# Patient Record
Sex: Female | Born: 1980 | Hispanic: Yes | Marital: Married | Smoking: Never smoker
Health system: Southern US, Community
[De-identification: ages and names within clinical notes are randomized; demographics above are authoritative.]

## PROBLEM LIST (undated history)

## (undated) DIAGNOSIS — N83209 Unspecified ovarian cyst, unspecified side: Secondary | ICD-10-CM

## (undated) HISTORY — PX: TUBAL LIGATION: SHX77

---

## 2004-07-04 ENCOUNTER — Emergency Department: Payer: Self-pay | Admitting: Emergency Medicine

## 2007-03-16 ENCOUNTER — Ambulatory Visit: Payer: Self-pay | Admitting: Family Medicine

## 2013-02-02 ENCOUNTER — Emergency Department: Payer: Self-pay | Admitting: Emergency Medicine

## 2013-02-02 LAB — URINALYSIS, COMPLETE
Bilirubin,UR: NEGATIVE
Glucose,UR: NEGATIVE mg/dL (ref 0–75)
Ketone: NEGATIVE
Leukocyte Esterase: NEGATIVE
Nitrite: NEGATIVE
Ph: 7 (ref 4.5–8.0)
Protein: NEGATIVE
RBC,UR: 1 /HPF (ref 0–5)
Specific Gravity: 1.008 (ref 1.003–1.030)
Squamous Epithelial: <1
WBC UR: NONE SEEN /HPF (ref 0–5)

## 2013-02-02 LAB — COMPREHENSIVE METABOLIC PANEL
Albumin: 3.9 g/dL (ref 3.4–5.0)
Alkaline Phosphatase: 96 U/L (ref 50–136)
Anion Gap: 3 — ABNORMAL LOW (ref 7–16)
BUN: 12 mg/dL (ref 7–18)
Bilirubin,Total: 0.4 mg/dL (ref 0.2–1.0)
Calcium, Total: 8.9 mg/dL (ref 8.5–10.1)
Chloride: 107 mmol/L (ref 98–107)
Co2: 28 mmol/L (ref 21–32)
Creatinine: 0.67 mg/dL (ref 0.60–1.30)
EGFR (African American): >60
EGFR (Non-African Amer.): >60
Glucose: 87 mg/dL (ref 65–99)
Osmolality: 275 (ref 275–301)
Potassium: 3.9 mmol/L (ref 3.5–5.1)
SGOT(AST): 35 U/L (ref 15–37)
SGPT (ALT): 49 U/L (ref 12–78)
Sodium: 138 mmol/L (ref 136–145)
Total Protein: 7.6 g/dL (ref 6.4–8.2)

## 2013-02-02 LAB — CBC
HCT: 41.2 % (ref 35.0–47.0)
HGB: 14.1 g/dL (ref 12.0–16.0)
MCH: 31.3 pg (ref 26.0–34.0)
MCHC: 34.3 g/dL (ref 32.0–36.0)
MCV: 91 fL (ref 80–100)
Platelet: 241 10*3/uL (ref 150–440)
RBC: 4.51 10*6/uL (ref 3.80–5.20)
RDW: 13.2 % (ref 11.5–14.5)
WBC: 9.2 10*3/uL (ref 3.6–11.0)

## 2013-02-02 LAB — LIPASE, BLOOD: Lipase: 136 U/L (ref 73–393)

## 2013-02-02 LAB — GC/CHLAMYDIA PROBE AMP

## 2013-02-02 LAB — PREGNANCY, URINE: Pregnancy Test, Urine: NEGATIVE m[IU]/mL

## 2013-02-02 LAB — WET PREP, GENITAL

## 2013-05-02 ENCOUNTER — Emergency Department: Payer: Self-pay | Admitting: Emergency Medicine

## 2013-06-13 ENCOUNTER — Emergency Department: Payer: Self-pay | Admitting: Emergency Medicine
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
## 2014-06-23 ENCOUNTER — Ambulatory Visit: Payer: Self-pay

## 2014-07-08 ENCOUNTER — Ambulatory Visit: Payer: Self-pay | Admitting: General Surgery

## 2014-07-08 ENCOUNTER — Encounter: Payer: Self-pay | Admitting: *Deleted

## 2015-10-20 ENCOUNTER — Emergency Department: Payer: Self-pay

## 2015-10-20 ENCOUNTER — Encounter: Payer: Self-pay | Admitting: Emergency Medicine

## 2015-10-20 ENCOUNTER — Emergency Department
Admission: EM | Admit: 2015-10-20 | Discharge: 2015-10-20 | Disposition: A | Discharge status: Home / Self Care | Payer: Self-pay | Attending: Emergency Medicine | Admitting: Emergency Medicine

## 2015-10-20 DIAGNOSIS — Z3202 Encounter for pregnancy test, result negative: Secondary | ICD-10-CM | POA: Insufficient documentation

## 2015-10-20 DIAGNOSIS — R102 Pelvic and perineal pain unspecified side: Secondary | ICD-10-CM

## 2015-10-20 DIAGNOSIS — R109 Unspecified abdominal pain: Secondary | ICD-10-CM

## 2015-10-20 DIAGNOSIS — R10A0 Flank pain, unspecified side: Secondary | ICD-10-CM

## 2015-10-20 DIAGNOSIS — N83202 Unspecified ovarian cyst, left side: Secondary | ICD-10-CM

## 2015-10-20 LAB — COMPREHENSIVE METABOLIC PANEL
ALT: 24 U/L (ref 14–54)
AST: 21 U/L (ref 15–41)
Albumin: 4.1 g/dL (ref 3.5–5.0)
Alkaline Phosphatase: 71 U/L (ref 38–126)
Anion gap: 8 (ref 5–15)
BUN: 8 mg/dL (ref 6–20)
CO2: 26 mmol/L (ref 22–32)
Calcium: 9.1 mg/dL (ref 8.9–10.3)
Chloride: 105 mmol/L (ref 101–111)
Creatinine, Ser: 0.59 mg/dL (ref 0.44–1.00)
GFR calc Af Amer: >60 mL/min (ref >60)
GFR calc non Af Amer: >60 mL/min (ref >60)
Glucose, Bld: 101 mg/dL — ABNORMAL HIGH (ref 65–99)
Potassium: 3.5 mmol/L (ref 3.5–5.1)
Sodium: 139 mmol/L (ref 135–145)
Total Bilirubin: 0.7 mg/dL (ref 0.3–1.2)
Total Protein: 7.5 g/dL (ref 6.5–8.1)

## 2015-10-20 LAB — CBC
HCT: 41.5 % (ref 35.0–47.0)
Hemoglobin: 14.3 g/dL (ref 12.0–16.0)
MCH: 32.1 pg (ref 26.0–34.0)
MCHC: 34.4 g/dL (ref 32.0–36.0)
MCV: 93.2 fL (ref 80.0–100.0)
Platelets: 219 10*3/uL (ref 150–440)
RBC: 4.45 MIL/uL (ref 3.80–5.20)
RDW: 13.2 % (ref 11.5–14.5)
WBC: 8.7 10*3/uL (ref 3.6–11.0)

## 2015-10-20 LAB — URINALYSIS COMPLETE WITH MICROSCOPIC (ARMC ONLY)
Bacteria, UA: NONE SEEN
Bilirubin Urine: NEGATIVE
Glucose, UA: NEGATIVE mg/dL
Ketones, ur: NEGATIVE mg/dL
Leukocytes, UA: NEGATIVE
Nitrite: NEGATIVE
Protein, ur: NEGATIVE mg/dL
Specific Gravity, Urine: 1.004 — ABNORMAL LOW (ref 1.005–1.030)
pH: 6 (ref 5.0–8.0)

## 2015-10-20 LAB — LIPASE, BLOOD: Lipase: 18 U/L (ref 11–51)

## 2015-10-20 LAB — CHLAMYDIA/NGC RT PCR (ARMC ONLY)
Chlamydia Tr: NOT DETECTED
N gonorrhoeae: NOT DETECTED

## 2015-10-20 LAB — WET PREP, GENITAL
Clue Cells Wet Prep HPF POC: NONE SEEN
Sperm: NONE SEEN
Trich, Wet Prep: NONE SEEN
Yeast Wet Prep HPF POC: NONE SEEN

## 2015-10-20 LAB — POCT PREGNANCY, URINE: Preg Test, Ur: NEGATIVE

## 2015-10-20 MED ORDER — ACETAMINOPHEN 325 MG PO TABS
650.0000 mg | ORAL_TABLET | Freq: Once | ORAL | Status: AC
  Administered 2015-10-20: 650 mg via ORAL
  Filled 2015-10-20: qty 2

## 2015-10-20 MED ORDER — OXYCODONE-ACETAMINOPHEN 5-325 MG PO TABS
1.0000 | ORAL_TABLET | Freq: Once | ORAL | Status: AC
  Administered 2015-10-20: 1 via ORAL

## 2015-10-20 MED ORDER — FENTANYL CITRATE (PF) 100 MCG/2ML IJ SOLN
50.0000 ug | Freq: Once | INTRAMUSCULAR | Status: AC
  Administered 2015-10-20: 50 ug via INTRAVENOUS

## 2015-10-20 MED ORDER — OXYCODONE-ACETAMINOPHEN 5-325 MG PO TABS: 1.0000 | ORAL_TABLET | Freq: Four times a day (QID) | ORAL | Status: DC | PRN

## 2015-10-20 MED ORDER — FENTANYL CITRATE (PF) 100 MCG/2ML IJ SOLN
INTRAMUSCULAR | Status: AC
  Administered 2015-10-20: 50 ug via INTRAVENOUS
  Filled 2015-10-20: qty 2

## 2015-10-20 MED ORDER — OXYCODONE-ACETAMINOPHEN 5-325 MG PO TABS
ORAL_TABLET | ORAL | Status: AC
  Filled 2015-10-20: qty 1

## 2015-10-20 NOTE — Discharge Instructions (Signed)
Dolor abdominal en adultos °(Abdominal Pain, Adult) °El dolor puede tener muchas causas. Normalmente la causa del dolor abdominal no es una enfermedad y mejorará sin tratamiento. Frecuentemente puede controlarse y tratarse en casa. Su médico le realizará un examen físico y posiblemente solicite análisis de sangre y radiografías para ayudar a determinar la gravedad de su dolor. Sin embargo, en muchos casos, debe transcurrir más tiempo antes de que se pueda encontrar una causa evidente del dolor. Antes de llegar a ese punto, es posible que su médico no sepa si necesita más pruebas o un tratamiento más profundo. °INSTRUCCIONES PARA EL CUIDADO EN EL HOGAR  °Esté atento al dolor para ver si hay cambios. Las siguientes indicaciones ayudarán a aliviar cualquier molestia que pueda sentir: °· Tome solo medicamentos de venta libre o recetados, según las indicaciones del médico. °· No tome laxantes a menos que se lo haya indicado su médico. °· Pruebe con una dieta líquida absoluta (caldo, té o agua) según se lo indique su médico. Introduzca gradualmente una dieta normal, según su tolerancia. °SOLICITE ATENCIÓN MÉDICA SI: °· Tiene dolor abdominal sin explicación. °· Tiene dolor abdominal relacionado con náuseas o diarrea. °· Tiene dolor cuando orina o defeca. °· Experimenta dolor abdominal que lo despierta de noche. °· Tiene dolor abdominal que empeora o mejora cuando come alimentos. °· Tiene dolor abdominal que empeora cuando come alimentos grasosos. °· Tiene fiebre. °SOLICITE ATENCIÓN MÉDICA DE INMEDIATO SI:  °· El dolor no desaparece en un plazo máximo de 2 horas. °· No deja de (vomitar). °· El dolor se siente solo en partes del abdomen, como el lado derecho o la parte inferior izquierda del abdomen. °· Evacúa materia fecal sanguinolenta o negra, de aspecto alquitranado. °ASEGÚRESE DE QUE: °· Comprende estas instrucciones. °· Controlará su afección. °· Recibirá ayuda de inmediato si no mejora o si empeora. °  °Esta  información no tiene como fin reemplazar el consejo del médico. Asegúrese de hacerle al médico cualquier pregunta que tenga. °  °Document Released: 08/01/2005 Document Revised: 08/22/2014 °Elsevier Interactive Patient Education ©2016 [employer] ° °

## 2015-10-20 NOTE — ED Notes (Signed)
Pt has called for a ride so she is waiting for now in the room.

## 2015-10-20 NOTE — ED Notes (Signed)
Has had left side flank pain .  Vomited x 2 today.  Saw pcp 2 week ago for ?uti and was given med.  Now pain is also in left lower quad.

## 2015-10-20 NOTE — ED Provider Notes (Addendum)
[facility] Emergency Department Provider Note  ____________________________________________   I have reviewed the triage vital signs and the nursing notes.   HISTORY  Chief Complaint Abdominal Pain; Flank Pain; and Nausea    HPI Sonia Nelson is a 35 y.o. female who is largely healthy. She states that 3 weeks ago she had some flank pain and though she does not recall dysuria,she was given antibiotics for possible infection.  After 6 days the abx ran out and the pain went away but then it came back. It was sudden onset again a few days ago. It actually started with her menstrual period which ended 6 days ago. It is in the L  flank towards the Llq. She did vomit x 2 nbnb when it got bad this am.  Nothing makes it better nothing makes it worse. Comes and goes. No fever.  No dysuria.  Has no recollection of prior sx like this. No personal or family hx of stones.  She has no cp no sob.  No diarrhea.   History reviewed. No pertinent past medical history.  There are no active problems to display for this patient.   No past surgical history on file.  No current outpatient prescriptions on file.  Allergies Shellfish allergy  No family history on file.  Social History Social History  Substance Use Topics  . Smoking status: Never Smoker   . Smokeless tobacco: None  . Alcohol Use: Yes    Review of Systems Constitutional: No fever/chills Eyes: No visual changes. ENT: No sore throat. No stiff neck no neck pain Cardiovascular: Denies chest pain. Respiratory: Denies shortness of breath. Gastrointestinal:   +  vomiting.  No diarrhea.  No constipation. Genitourinary: Negative for dysuria. Musculoskeletal: Negative lower extremity swelling Skin: Negative for rash. Neurological: Negative for headaches, focal weakness or numbness. 10-point ROS otherwise negative.  ____________________________________________   PHYSICAL EXAM:  VITAL SIGNS: ED Triage  Vitals  Enc Vitals Group     BP 10/20/15 1053 125/62 mmHg     Pulse Rate 10/20/15 1053 82     Resp 10/20/15 1053 16     Temp 10/20/15 1053 98 F (36.7 C)     Temp Source 10/20/15 1053 Oral     SpO2 10/20/15 1053 98 %     Weight 10/20/15 1053 180 lb (81.647 kg)     Height 10/20/15 1053 5\' 6"  (1.676 m)     Head Cir --      Peak Flow --      Pain Score 10/20/15 1054 8     Pain Loc --      Pain Edu? --      Excl. in GC? --     Constitutional: Alert and oriented. Well appearing and in no acute distress. Eyes: Conjunctivae are normal. PERRL. EOMI. Head: Atraumatic. Nose: No congestion/rhinnorhea. Mouth/Throat: Mucous membranes are moist.  Oropharynx non-erythematous. Neck: No stridor.   Nontender with no meningismus Cardiovascular: Normal rate, regular rhythm. Grossly normal heart sounds.  Good peripheral circulation. Respiratory: Normal respiratory effort.  No retractions. Lungs CTAB. Abdominal: Soft and minimally tender in the llq, which could be c/w referred flank pain  No distention. No guarding no rebound Back:  There is no focal tenderness or step off there is no midline tenderness there are no lesions noted. there is mild L CVA tenderness Musculoskeletal: No lower extremity tenderness. No joint effusions, no DVT signs strong distal pulses no edema Neurologic:  Normal speech and language. No gross focal  neurologic deficits are appreciated.  Skin:  Skin is warm, dry and intact. No rash noted. Psychiatric: Mood and affect are normal. Speech and behavior are normal.  ____________________________________________   LABS (all labs ordered are listed, but only abnormal results are displayed)  Labs Reviewed  COMPREHENSIVE METABOLIC PANEL - Abnormal; Notable for the following:    Glucose, Bld 101 (*)    All other components within normal limits  URINALYSIS COMPLETEWITH MICROSCOPIC ([facility] ONLY) - Abnormal; Notable for the following:    Color, Urine STRAW (*)    APPearance CLEAR (*)     Specific Gravity, Urine 1.004 (*)    Hgb urine dipstick 2+ (*)    Squamous Epithelial / LPF 0-5 (*)    All other components within normal limits  LIPASE, BLOOD  CBC  POC URINE PREG, ED  POCT PREGNANCY, URINE   ____________________________________________  EKG  I personally interpreted any EKGs ordered by me or triage  ____________________________________________  RADIOLOGY  I reviewed any imaging ordered by me or triage that were performed during my shift and, if possible, patient and/or family made aware of any abnormal findings. ____________________________________________   PROCEDURES  Procedure(s) performed: None  Critical Care performed: None  ____________________________________________   INITIAL IMPRESSION / ASSESSMENT AND PLAN / ED COURSE  Pertinent labs & imaging results that were available during my care of the patient were reviewed by me and considered in my medical decision making (see chart for details).  Patient with hematuria microscopic, and on and off flank pain on the left the last several weeks. Denies dysuria. Does have microscopic hematuria.  ----------------------------------------- 6:06 PM on 10/20/2015 -----------------------------------------  Patient very careful. I did advise her she must drive home after her pain medication made given her here. Patient now recalls that she does have history of ovarian cysts, and ultrasound does show an ovarian cyst. No evidence of significant hemorrhage, she is feeling much better. Extensive return precautions for lightheadedness or increased pain given. Does not have hemoperitoneum at this time. We did perform a pelvic exam on her,  Pelvic exam: Female nurse chaperone present, no external lesions noted, physiologic vaginal discharge noted with no purulent discharge, no cervical motion tenderness, no adnexal mass, positive for left adnexal tenderness there is no significant uterine tenderness or mass. No  vaginal bleeding____________________________________________   FINAL CLINICAL IMPRESSION(S) / ED DIAGNOSES  Final diagnoses:  Flank pain      This chart was dictated using voice recognition software.  Despite best efforts to proofread,  errors can occur which can change meaning.     Jeanmarie Plant, MD 10/20/15 1444  Jeanmarie Plant, MD 10/20/15 [phone]

## 2015-10-23 LAB — URINE CULTURE

## 2016-12-02 ENCOUNTER — Encounter: Payer: Self-pay | Admitting: Emergency Medicine

## 2016-12-02 ENCOUNTER — Emergency Department
Admission: EM | Admit: 2016-12-02 | Discharge: 2016-12-02 | Disposition: A | Discharge status: Home / Self Care | Payer: Self-pay | Attending: Emergency Medicine | Admitting: Emergency Medicine

## 2016-12-02 DIAGNOSIS — X58XXXA Exposure to other specified factors, initial encounter: Secondary | ICD-10-CM | POA: Insufficient documentation

## 2016-12-02 DIAGNOSIS — Z79899 Other long term (current) drug therapy: Secondary | ICD-10-CM | POA: Insufficient documentation

## 2016-12-02 DIAGNOSIS — Y939 Activity, unspecified: Secondary | ICD-10-CM | POA: Insufficient documentation

## 2016-12-02 DIAGNOSIS — Y999 Unspecified external cause status: Secondary | ICD-10-CM | POA: Insufficient documentation

## 2016-12-02 DIAGNOSIS — N760 Acute vaginitis: Secondary | ICD-10-CM | POA: Insufficient documentation

## 2016-12-02 DIAGNOSIS — T192XXA Foreign body in vulva and vagina, initial encounter: Secondary | ICD-10-CM | POA: Insufficient documentation

## 2016-12-02 DIAGNOSIS — B9689 Other specified bacterial agents as the cause of diseases classified elsewhere: Secondary | ICD-10-CM

## 2016-12-02 DIAGNOSIS — Y929 Unspecified place or not applicable: Secondary | ICD-10-CM | POA: Insufficient documentation

## 2016-12-02 HISTORY — DX: Unspecified ovarian cyst, unspecified side: N83.209

## 2016-12-02 LAB — CBC
HCT: 40.5 % (ref 35.0–47.0)
Hemoglobin: 13.7 g/dL (ref 12.0–16.0)
MCH: 31.9 pg (ref 26.0–34.0)
MCHC: 33.7 g/dL (ref 32.0–36.0)
MCV: 94.5 fL (ref 80.0–100.0)
Platelets: 286 10*3/uL (ref 150–440)
RBC: 4.29 MIL/uL (ref 3.80–5.20)
RDW: 12.8 % (ref 11.5–14.5)
WBC: 8.4 10*3/uL (ref 3.6–11.0)

## 2016-12-02 LAB — COMPREHENSIVE METABOLIC PANEL
ALT: 34 U/L (ref 14–54)
AST: 33 U/L (ref 15–41)
Albumin: 4.3 g/dL (ref 3.5–5.0)
Alkaline Phosphatase: 68 U/L (ref 38–126)
Anion gap: 6 (ref 5–15)
BUN: 8 mg/dL (ref 6–20)
CO2: 25 mmol/L (ref 22–32)
Calcium: 9.4 mg/dL (ref 8.9–10.3)
Chloride: 106 mmol/L (ref 101–111)
Creatinine, Ser: 0.64 mg/dL (ref 0.44–1.00)
GFR calc Af Amer: >60 mL/min (ref >60)
GFR calc non Af Amer: >60 mL/min (ref >60)
Glucose, Bld: 91 mg/dL (ref 65–99)
Potassium: 3.6 mmol/L (ref 3.5–5.1)
Sodium: 137 mmol/L (ref 135–145)
Total Bilirubin: 0.7 mg/dL (ref 0.3–1.2)
Total Protein: 7.7 g/dL (ref 6.5–8.1)

## 2016-12-02 LAB — URINALYSIS, COMPLETE (UACMP) WITH MICROSCOPIC
Bilirubin Urine: NEGATIVE
Glucose, UA: NEGATIVE mg/dL
Ketones, ur: NEGATIVE mg/dL
Leukocytes, UA: NEGATIVE
Nitrite: NEGATIVE
Protein, ur: NEGATIVE mg/dL
Specific Gravity, Urine: 1.003 — ABNORMAL LOW (ref 1.005–1.030)
pH: 7 (ref 5.0–8.0)

## 2016-12-02 LAB — LIPASE, BLOOD: Lipase: 28 U/L (ref 11–51)

## 2016-12-02 LAB — CHLAMYDIA/NGC RT PCR (ARMC ONLY)
Chlamydia Tr: NOT DETECTED
N gonorrhoeae: NOT DETECTED

## 2016-12-02 LAB — WET PREP, GENITAL
Sperm: NONE SEEN
Trich, Wet Prep: NONE SEEN
Yeast Wet Prep HPF POC: NONE SEEN

## 2016-12-02 LAB — POCT PREGNANCY, URINE: Preg Test, Ur: NEGATIVE

## 2016-12-02 MED ORDER — IBUPROFEN 600 MG PO TABS
ORAL_TABLET | ORAL | Status: AC
  Administered 2016-12-02: 600 mg via ORAL
  Filled 2016-12-02: qty 1

## 2016-12-02 MED ORDER — METRONIDAZOLE 500 MG PO TABS: 500.0000 mg | ORAL_TABLET | Freq: Two times a day (BID) | ORAL | 0 refills | Status: AC

## 2016-12-02 MED ORDER — HYDROCODONE-ACETAMINOPHEN 5-325 MG PO TABS
1.0000 | ORAL_TABLET | Freq: Once | ORAL | Status: AC
  Administered 2016-12-02: 1 via ORAL
  Filled 2016-12-02: qty 1

## 2016-12-02 MED ORDER — IBUPROFEN 400 MG PO TABS
600.0000 mg | ORAL_TABLET | Freq: Once | ORAL | Status: AC
  Administered 2016-12-02: 600 mg via ORAL

## 2016-12-02 MED ORDER — CEFTRIAXONE SODIUM 250 MG IJ SOLR
250.0000 mg | Freq: Once | INTRAMUSCULAR | Status: AC
  Administered 2016-12-02: 250 mg via INTRAMUSCULAR
  Filled 2016-12-02: qty 250

## 2016-12-02 MED ORDER — AZITHROMYCIN 250 MG PO TABS
1000.0000 mg | ORAL_TABLET | Freq: Once | ORAL | Status: AC
  Administered 2016-12-02: 1000 mg via ORAL
  Filled 2016-12-02: qty 4

## 2016-12-02 MED ORDER — LIDOCAINE HCL (PF) 1 % IJ SOLN
INTRAMUSCULAR | Status: AC
  Administered 2016-12-02: 5 mL
  Filled 2016-12-02: qty 5

## 2016-12-02 MED ORDER — CEPHALEXIN 500 MG PO CAPS: 500.0000 mg | ORAL_CAPSULE | Freq: Four times a day (QID) | ORAL | 0 refills | Status: AC

## 2016-12-02 NOTE — ED Provider Notes (Signed)
[facility] Emergency Department Provider Note  ____________________________________________   First MD Initiated Contact with Patient 12/02/16 1748     (approximate)  I have reviewed the triage vital signs and the nursing notes.   HISTORY  Chief Complaint Flank Pain; Abdominal Pain; and Vaginal Discharge    HPI Sonia Nelson is a 36 y.o. female who comes to the emergency Department with roughly 2 weeks of foul-smelling vaginal discharge. She reports thin foul-smelling fluid draining from her vagina. Her last period was 3 weeks ago. She last had sex about 2 weeks ago. She does report a temperature of 101 about a week ago but that is resolved. She recently returned from [place] trip to [place]She does report some dysuria but no frequency or hesitancy. She denies back pain.   Past Medical History:  Diagnosis Date  . Ovarian cyst     There are no active problems to display for this patient.   Past Surgical History:  Procedure Laterality Date  . TUBAL LIGATION      Prior to Admission medications   Medication Sig Start Date End Date Taking? Authorizing Provider  oxyCODONE-acetaminophen (ROXICET) 5-325 MG tablet Take 1 tablet by mouth every 6 (six) hours as needed. 10/20/15   Jeanmarie Plant, MD    Allergies Shellfish allergy  No family history on file.  Social History Social History  Substance Use Topics  . Smoking status: Never Smoker  . Smokeless tobacco: Never Used  . Alcohol use Yes     Comment: occasionally    Review of Systems Constitutional: Positive fever Eyes: No visual changes. ENT: No sore throat. Cardiovascular: Denies chest pain. Respiratory: Denies shortness of breath. Gastrointestinal: Positive abdominal pain.  No nausea, no vomiting.  No diarrhea.  No constipation. Genitourinary: Positive for dysuria. Musculoskeletal: Negative for back pain. Neurological: Negative for headaches, focal weakness or  numbness.  10-point ROS otherwise negative.  ____________________________________________   PHYSICAL EXAM:  VITAL SIGNS: ED Triage Vitals  Enc Vitals Group     BP 12/02/16 1649 132/70     Pulse Rate 12/02/16 1649 86     Resp 12/02/16 1649 18     Temp 12/02/16 1649 98.5 F (36.9 C)     Temp Source 12/02/16 1649 Oral     SpO2 12/02/16 1649 97 %     Weight 12/02/16 1650 175 lb (79.4 kg)     Height 12/02/16 1650  (1.651 m)     Head Circumference --      Peak Flow --      Pain Score 12/02/16 1648 6     Pain Loc --      Pain Edu? --      Excl. in GC? --     Constitutional: Alert and oriented x 4 well appearing nontoxic no diaphoresis speaks in full, clear sentences Eyes: PERRL EOMI. Head: Atraumatic. Nose: No congestion/rhinnorhea. Mouth/Throat: No trismus Neck: No stridor.   Cardiovascular: Normal rate, regular rhythm. Grossly normal heart sounds.  Good peripheral circulation. Respiratory: Normal respiratory effort.  No retractions. Lungs CTAB and moving good air Gastrointestinal: Soft nondistended nontender no rebound no guarding no peritonitis no McBurney's tenderness negative Rovsing's no costovertebral tenderness negative Murphy's Genitourinary: Genital exam performed with female chaperone: Normal external exam large gray brown discolored foreign body visualized in the vault easily removed with ring forceps copious amount of thin gray foul-smelling discharge in the vault all sclerae no cervical motion tenderness no adnexal tenderness Musculoskeletal: No  lower extremity edema   Neurologic:  Normal speech and language. No gross focal neurologic deficits are appreciated. Skin:  Skin is warm, dry and intact. No rash noted. Psychiatric: Mood and affect are normal. Speech and behavior are normal.    ____________________________________________   LABS (all labs ordered are listed, but only abnormal results are displayed)  Labs Reviewed  URINALYSIS, COMPLETE (UACMP)  WITH MICROSCOPIC - Abnormal; Notable for the following:       Result Value   Color, Urine STRAW (*)    APPearance CLEAR (*)    Specific Gravity, Urine 1.003 (*)    Hgb urine dipstick SMALL (*)    Bacteria, UA RARE (*)    Squamous Epithelial / LPF 0-5 (*)    All other components within normal limits  WET PREP, GENITAL  CHLAMYDIA/NGC RT PCR ([facility] ONLY)  LIPASE, BLOOD  COMPREHENSIVE METABOLIC PANEL  CBC  POCT PREGNANCY, URINE  POC URINE PREG, ED     __________________________________________  EKG   ____________________________________________  RADIOLOGY   ____________________________________________   PROCEDURES  Procedure(s) performed: no  Procedures  Critical Care performed: no  ____________________________________________   INITIAL IMPRESSION / ASSESSMENT AND PLAN / ED COURSE  Pertinent labs & imaging results that were available during my care of the patient were reviewed by me and considered in my medical decision making (see chart for details).  Genital exam on this patient revealed a retained tampon. The patient feels significantly improved after removal. She does have a copious amount of thin gray discharge and she is positive for bacterial vaginosis. I swabbed for GC and CT and offered her expectant management waiting for the results versus treatment here today and she opted for treatment which I think is reasonable. I will also treat her with a short course of Keflex for prophylaxis against toxic shock syndrome. She is hemodynamically stable and has no evidence of this at this point. She is discharged home in improved condition.      ____________________________________________   FINAL CLINICAL IMPRESSION(S) / ED DIAGNOSES  Final diagnoses:  None      NEW MEDICATIONS STARTED DURING THIS VISIT:  New Prescriptions   No medications on file     Note:  This document was prepared using Dragon voice recognition software and may include  unintentional dictation errors.     Merrily Brittle, MD 12/03/16 1025

## 2016-12-02 NOTE — Discharge Instructions (Addendum)
Please take all of your antibiotics as prescribed. Return to the emergency department sooner for any new or worsening symptoms such as fevers, chills, worsening pain, or for any other concerns whatsoever.  It was a pleasure to take care of you today, and thank you for coming to our emergency department.  If you have any questions or concerns before leaving please ask the nurse to grab me and I'm more than happy to go through your aftercare instructions again.  If you were prescribed any opioid pain medication today such as Norco, Vicodin, Percocet, morphine, hydrocodone, or oxycodone please make sure you do not drive when you are taking this medication as it can alter your ability to drive safely.  If you have any concerns once you are home that you are not improving or are in fact getting worse before you can make it to your follow-up appointment, please do not hesitate to call 911 and come back for further evaluation.  Merrily Brittle MD  Results for orders placed or performed during the hospital encounter of 12/02/16  Wet prep, genital  Result Value Ref Range   Yeast Wet Prep HPF POC NONE SEEN NONE SEEN   Trich, Wet Prep NONE SEEN NONE SEEN   Clue Cells Wet Prep HPF POC PRESENT (A) NONE SEEN   WBC, Wet Prep HPF POC MODERATE (A) NONE SEEN   Sperm NONE SEEN   Lipase, blood  Result Value Ref Range   Lipase 28 11 - 51 U/L  Comprehensive metabolic panel  Result Value Ref Range   Sodium 137 135 - 145 mmol/L   Potassium 3.6 3.5 - 5.1 mmol/L   Chloride 106 101 - 111 mmol/L   CO2 25 22 - 32 mmol/L   Glucose, Bld 91 65 - 99 mg/dL   BUN 8 6 - 20 mg/dL   Creatinine, Ser 1.61 0.44 - 1.00 mg/dL   Calcium 9.4 8.9 - 09.6 mg/dL   Total Protein 7.7 6.5 - 8.1 g/dL   Albumin 4.3 3.5 - 5.0 g/dL   AST 33 15 - 41 U/L   ALT 34 14 - 54 U/L   Alkaline Phosphatase 68 38 - 126 U/L   Total Bilirubin 0.7 0.3 - 1.2 mg/dL   GFR calc non Af Amer >60 >60 mL/min   GFR calc Af Amer >60 >60 mL/min   Anion gap 6  5 - 15  CBC  Result Value Ref Range   WBC 8.4 3.6 - 11.0 K/uL   RBC 4.29 3.80 - 5.20 MIL/uL   Hemoglobin 13.7 12.0 - 16.0 g/dL   HCT 04.5 40.9 - 81.1 %   MCV 94.5 80.0 - 100.0 fL   MCH 31.9 26.0 - 34.0 pg   MCHC 33.7 32.0 - 36.0 g/dL   RDW 91.4 78.2 - 95.6 %   Platelets 286 150 - 440 K/uL  Urinalysis, Complete w Microscopic  Result Value Ref Range   Color, Urine STRAW (A) YELLOW   APPearance CLEAR (A) CLEAR   Specific Gravity, Urine 1.003 (L) 1.005 - 1.030   pH 7.0 5.0 - 8.0   Glucose, UA NEGATIVE NEGATIVE mg/dL   Hgb urine dipstick SMALL (A) NEGATIVE   Bilirubin Urine NEGATIVE NEGATIVE   Ketones, ur NEGATIVE NEGATIVE mg/dL   Protein, ur NEGATIVE NEGATIVE mg/dL   Nitrite NEGATIVE NEGATIVE   Leukocytes, UA NEGATIVE NEGATIVE   RBC / HPF 0-5 0 - 5 RBC/hpf   WBC, UA 0-5 0 - 5 WBC/hpf   Bacteria, UA RARE (A)  NONE SEEN   Squamous Epithelial / LPF 0-5 (A) NONE SEEN  Pregnancy, urine POC  Result Value Ref Range   Preg Test, Ur NEGATIVE NEGATIVE

## 2016-12-02 NOTE — ED Triage Notes (Signed)
Patient presents to the ED with abdominal pain, pelvic pain and flank pain x 2-3 days.  Patient reports she was in [place] last week and while she was there she had a fever, then several days ago patient is having yellow foul smelling vaginal discharge.  Patient denies any new sexual partners.

## 2018-08-23 ENCOUNTER — Emergency Department: Payer: Worker's Compensation

## 2018-08-23 ENCOUNTER — Encounter: Payer: Self-pay | Admitting: Emergency Medicine

## 2018-08-23 ENCOUNTER — Other Ambulatory Visit: Payer: Self-pay

## 2018-08-23 ENCOUNTER — Emergency Department
Admission: EM | Admit: 2018-08-23 | Discharge: 2018-08-23 | Disposition: A | Discharge status: Home / Self Care | Payer: Worker's Compensation | Attending: Emergency Medicine | Admitting: Emergency Medicine

## 2018-08-23 DIAGNOSIS — Y998 Other external cause status: Secondary | ICD-10-CM | POA: Diagnosis not present

## 2018-08-23 DIAGNOSIS — W19XXXA Unspecified fall, initial encounter: Secondary | ICD-10-CM

## 2018-08-23 DIAGNOSIS — Y9301 Activity, walking, marching and hiking: Secondary | ICD-10-CM | POA: Diagnosis not present

## 2018-08-23 DIAGNOSIS — S199XXA Unspecified injury of neck, initial encounter: Secondary | ICD-10-CM | POA: Diagnosis not present

## 2018-08-23 DIAGNOSIS — Y9289 Other specified places as the place of occurrence of the external cause: Secondary | ICD-10-CM | POA: Insufficient documentation

## 2018-08-23 DIAGNOSIS — S3992XA Unspecified injury of lower back, initial encounter: Secondary | ICD-10-CM | POA: Diagnosis not present

## 2018-08-23 DIAGNOSIS — W010XXA Fall on same level from slipping, tripping and stumbling without subsequent striking against object, initial encounter: Secondary | ICD-10-CM | POA: Diagnosis not present

## 2018-08-23 DIAGNOSIS — M62838 Other muscle spasm: Secondary | ICD-10-CM | POA: Insufficient documentation

## 2018-08-23 DIAGNOSIS — M545 Low back pain, unspecified: Secondary | ICD-10-CM

## 2018-08-23 DIAGNOSIS — S4992XA Unspecified injury of left shoulder and upper arm, initial encounter: Secondary | ICD-10-CM | POA: Diagnosis present

## 2018-08-23 MED ORDER — ACETAMINOPHEN 500 MG PO TABS
1000.0000 mg | ORAL_TABLET | Freq: Once | ORAL | Status: AC
  Administered 2018-08-23: 1000 mg via ORAL
  Filled 2018-08-23: qty 2

## 2018-08-23 MED ORDER — OXYCODONE-ACETAMINOPHEN 5-325 MG PO TABS: 1.0000 | ORAL_TABLET | ORAL | 0 refills | Status: AC | PRN

## 2018-08-23 MED ORDER — IBUPROFEN 800 MG PO TABS: 800.0000 mg | ORAL_TABLET | Freq: Three times a day (TID) | ORAL | 0 refills | Status: AC | PRN

## 2018-08-23 MED ORDER — OXYCODONE HCL 5 MG PO TABS
5.0000 mg | ORAL_TABLET | Freq: Once | ORAL | Status: AC
  Administered 2018-08-23: 5 mg via ORAL
  Filled 2018-08-23: qty 1

## 2018-08-23 MED ORDER — CYCLOBENZAPRINE HCL 10 MG PO TABS
5.0000 mg | ORAL_TABLET | Freq: Once | ORAL | Status: AC
  Administered 2018-08-23: 5 mg via ORAL
  Filled 2018-08-23: qty 1

## 2018-08-23 MED ORDER — CYCLOBENZAPRINE HCL 5 MG PO TABS: ORAL_TABLET | ORAL | 0 refills | Status: AC

## 2018-08-23 NOTE — ED Triage Notes (Signed)
Fell at work. Slipped on a ramp.

## 2018-08-23 NOTE — Discharge Instructions (Signed)
You were seen today after a fall.  Your x-rays do not show any broken bones but do show that you are having some muscle spasms in your neck.  Please begin Flexeril and ibuprofen for pain.  You may also take Percocet for extreme pain.  Follow-up with primary care on Monday for reevaluation.

## 2018-08-23 NOTE — ED Provider Notes (Signed)
[facility] Emergency Department Provider Note  ____________________________________________  Time seen: Approximately 12:43 PM  I have reviewed the triage vital signs and the nursing notes.   HISTORY  Chief Complaint Fall and Back Pain    HPI Sonia Nelson is a 38 y.o. female that presents emergency department for evaluation of whole body pain after falling on a slippery ramp at work today.  Patient states that she slipped and fell backwards landing on her back.  She needed her boss to help her get up.  She was given two "pills "of "probably ibuprofen."  She proceeded to drive herself to the emergency department.  She states that everything hurts and she is having difficulty pinpointing any location that hurts the worst.  If she had to choose, what hurt the worst, it would be between her left shoulder and neck and to her low back.  Patient states that she hears cracking when she moves her left shoulder.  She did hit the back of her head.  She did not lose consciousness.  She is not on any blood thinners.   Past Medical History:  Diagnosis Date  . Ovarian cyst     There are no active problems to display for this patient.   Past Surgical History:  Procedure Laterality Date  . TUBAL LIGATION      Prior to Admission medications   Medication Sig Start Date End Date Taking? Authorizing Provider  cyclobenzaprine (FLEXERIL) 5 MG tablet Take 1-2 tablets 3 times daily as needed 08/23/18   Enid Derry, PA-C  ibuprofen (ADVIL,MOTRIN) 800 MG tablet Take 1 tablet (800 mg total) by mouth every 8 (eight) hours as needed. 08/23/18   Enid Derry, PA-C  oxyCODONE-acetaminophen (PERCOCET) 5-325 MG tablet Take 1 tablet by mouth every 4 (four) hours as needed for severe pain. 08/23/18 08/23/19  Enid Derry, PA-C    Allergies Shellfish allergy  No family history on file.  Social History Social History   Tobacco Use  . Smoking status: Never Smoker  . Smokeless  tobacco: Never Used  Substance Use Topics  . Alcohol use: Yes    Comment: occasionally  . Drug use: Not on file     Review of Systems  Cardiovascular: No chest pain. Respiratory: No SOB. Gastrointestinal: No abdominal pain.  No nausea, no vomiting.  Musculoskeletal: Positive for overall musculoskeletal pain. Skin: Negative for rash, abrasions, lacerations, ecchymosis. Neurological: Negative for headaches, numbness or tingling   ____________________________________________   PHYSICAL EXAM:  VITAL SIGNS: ED Triage Vitals  Enc Vitals Group     BP 08/23/18 1138 138/78     Pulse Rate 08/23/18 1138 78     Resp 08/23/18 1138 20     Temp 08/23/18 1138 98.3 F (36.8 C)     Temp Source 08/23/18 1138 Oral     SpO2 08/23/18 1138 98 %     Weight 08/23/18 1125 180 lb (81.6 kg)     Height 08/23/18 1125 5\' 5"  (1.651 m)     Head Circumference --      Peak Flow --      Pain Score 08/23/18 1125 8     Pain Loc --      Pain Edu? --      Excl. in GC? --      Constitutional: Alert and oriented. Well appearing and in no acute distress. Eyes: Conjunctivae are normal. PERRL. EOMI. Head: Atraumatic. ENT:      Ears:      Nose: No  congestion/rhinnorhea.      Mouth/Throat: Mucous membranes are moist.  Neck: No stridor.  No cervical spine tenderness to palpation.  Tenderness to palpation between neck and left shoulder.  Full range of motion of left shoulder. Cardiovascular: Normal rate, regular rhythm.  Good peripheral circulation. Respiratory: Normal respiratory effort without tachypnea or retractions. Lungs CTAB. Good air entry to the bases with no decreased or absent breath sounds. Gastrointestinal: Bowel sounds 4 quadrants. Soft and nontender to palpation. No guarding or rigidity. No palpable masses. No distention.  Musculoskeletal: Full range of motion to all extremities. No gross deformities appreciated.  Tenderness to palpation to left lumbar paraspinal muscles.  No tenderness to  palpation to lumbar spine.  Normal but slow gait. Neurologic:  Normal speech and language. No gross focal neurologic deficits are appreciated.  Skin:  Skin is warm, dry and intact. No rash noted. Psychiatric: Mood and affect are normal. Speech and behavior are normal. Patient exhibits appropriate insight and judgement.   ____________________________________________   LABS (all labs ordered are listed, but only abnormal results are displayed)  Labs Reviewed  POC URINE PREG, ED   ____________________________________________  EKG   ____________________________________________  RADIOLOGY Lexine BatonI, Stephon Weathers, personally viewed and evaluated these images (plain radiographs) as part of my medical decision making, as well as reviewing the written report by the radiologist.  Dg Cervical Spine 2-3 Views  Result Date: 08/23/2018 CLINICAL DATA:  Fall. EXAM: CERVICAL SPINE - 2-3 VIEW COMPARISON:  CT 06/13/2013. FINDINGS: Mild straightening cervical spine. No acute bony abnormality identified. No evidence of fracture dislocation. Pulmonary apices are clear. Exam appears stable from prior CT. IMPRESSION: Mild straightening cervical spine. Exam otherwise negative. No evidence of fracture or dislocation. Electronically Signed   By: Maisie Fushomas  Register   On: 08/23/2018 13:32   Dg Lumbar Spine 2-3 Views  Result Date: 08/23/2018 CLINICAL DATA:  Low back pain after fall at work today. EXAM: LUMBAR SPINE - 2-3 VIEW COMPARISON:  CT scan of October 20, 2015. FINDINGS: No fracture or spondylolisthesis is noted. Mild degenerative disc disease is noted at L2-3 with anterior osteophyte formation. Remaining disc spaces are unremarkable. IMPRESSION: Mild degenerative disc disease is noted at L2-3. No acute abnormality seen in the lumbar spine. Electronically Signed   By: Lupita RaiderJames  Green Jr, M.D.   On: 08/23/2018 13:33   Dg Shoulder Left  Result Date: 08/23/2018 CLINICAL DATA:  Left shoulder pain after fall at work. EXAM: LEFT  SHOULDER - 2+ VIEW COMPARISON:  None. FINDINGS: There is no evidence of fracture or dislocation. There is no evidence of arthropathy or other focal bone abnormality. Soft tissues are unremarkable. IMPRESSION: Negative. Electronically Signed   By: Lupita RaiderJames  Green Jr, M.D.   On: 08/23/2018 13:31    ____________________________________________    PROCEDURES  Procedure(s) performed:    Procedures    Medications  acetaminophen (TYLENOL) tablet 1,000 mg (1,000 mg Oral Given 08/23/18 1323)  oxyCODONE (Oxy IR/ROXICODONE) immediate release tablet 5 mg (5 mg Oral Given 08/23/18 1436)  cyclobenzaprine (FLEXERIL) tablet 5 mg (5 mg Oral Given 08/23/18 1436)     ____________________________________________   INITIAL IMPRESSION / ASSESSMENT AND PLAN / ED COURSE  Pertinent labs & imaging results that were available during my care of the patient were reviewed by me and considered in my medical decision making (see chart for details).  Review of the [place] CSRS was performed in accordance of the [facility] prior to dispensing any controlled drugs.   Patient presented the emergency department for  evaluation after fall.  Vital signs and exam are reassuring.  Patient is up walking around the emergency department slowly but without difficulty.  Cervical x-ray, shoulder x-ray, lumbar x-ray are negative for acute bony abnormalities.  Patient is agreeable to call primary care tomorrow for an appointment on Monday.  Patient will be discharged home with prescriptions for ibuprofen, Flexeril, a short course of Percocet. Patient is to follow up with primary care as directed. Patient is given ED precautions to return to the ED for any worsening or new symptoms.     ____________________________________________  FINAL CLINICAL IMPRESSION(S) / ED DIAGNOSES  Final diagnoses:  Fall, initial encounter  Acute left-sided low back pain without sciatica  Muscle spasm      NEW MEDICATIONS STARTED DURING THIS VISIT:  ED  Discharge Orders         Ordered    oxyCODONE-acetaminophen (PERCOCET) 5-325 MG tablet  Every 4 hours PRN     08/23/18 1423    ibuprofen (ADVIL,MOTRIN) 800 MG tablet  Every 8 hours PRN     08/23/18 1423    cyclobenzaprine (FLEXERIL) 5 MG tablet     08/23/18 1423              This chart was dictated using voice recognition software/Dragon. Despite best efforts to proofread, errors can occur which can change the meaning. Any change was purely unintentional.    Enid Derry, PA-C 08/23/18 1516    Emily Filbert, MD 08/23/18 1524

## 2018-08-23 NOTE — ED Notes (Signed)
See triage note  Presents s/p fall  States she works at [employer] and slipped on ramp  States her left leg went back behind her  having pain to entire left leg and left posterior shoulder area  Ambulates to room with slight limp d/t pain

## 2020-12-30 ENCOUNTER — Other Ambulatory Visit: Payer: Self-pay | Admitting: Obstetrics and Gynecology

## 2020-12-30 DIAGNOSIS — Z1231 Encounter for screening mammogram for malignant neoplasm of breast: Secondary | ICD-10-CM

## 2021-01-12 ENCOUNTER — Other Ambulatory Visit: Payer: Self-pay

## 2021-01-12 ENCOUNTER — Ambulatory Visit
Admission: RE | Admit: 2021-01-12 | Discharge: 2021-01-12 | Disposition: A | Discharge status: Home / Self Care | Payer: Self-pay | Source: Ambulatory Visit | Attending: Obstetrics and Gynecology | Admitting: Obstetrics and Gynecology

## 2021-01-12 DIAGNOSIS — Z1231 Encounter for screening mammogram for malignant neoplasm of breast: Secondary | ICD-10-CM | POA: Insufficient documentation

## 2021-01-26 ENCOUNTER — Ambulatory Visit: Payer: Self-pay

## 2021-02-25 ENCOUNTER — Ambulatory Visit: Payer: Self-pay

## 2023-09-19 ENCOUNTER — Encounter: Payer: Self-pay | Admitting: Family Medicine

## 2023-09-19 ENCOUNTER — Telehealth: Payer: Self-pay | Admitting: *Deleted
